# Patient Record
Sex: Female | Born: 1975 | Hispanic: Yes | Marital: Single | Smoking: Never smoker
Health system: Southern US, Community
[De-identification: ages and names within clinical notes are randomized; demographics above are authoritative.]

---

## 2009-03-09 ENCOUNTER — Emergency Department: Payer: Self-pay | Admitting: Emergency Medicine

## 2010-01-21 ENCOUNTER — Emergency Department: Payer: Self-pay | Admitting: Emergency Medicine

## 2013-03-07 ENCOUNTER — Ambulatory Visit: Payer: Self-pay | Admitting: Family Medicine

## 2013-09-26 ENCOUNTER — Inpatient Hospital Stay: Payer: Self-pay

## 2013-09-27 LAB — CBC WITH DIFFERENTIAL/PLATELET
BASOS ABS: 0 10*3/uL (ref 0.0–0.1)
BASOS PCT: 0.3 %
Eosinophil #: 0 10*3/uL (ref 0.0–0.7)
Eosinophil %: 0.8 %
HCT: 35.4 % (ref 35.0–47.0)
HGB: 11.4 g/dL — AB (ref 12.0–16.0)
LYMPHS ABS: 2.1 10*3/uL (ref 1.0–3.6)
Lymphocyte %: 39.3 %
MCH: 29.1 pg (ref 26.0–34.0)
MCHC: 32.1 g/dL (ref 32.0–36.0)
MCV: 91 fL (ref 80–100)
MONOS PCT: 7.9 %
Monocyte #: 0.4 x10 3/mm (ref 0.2–0.9)
Neutrophil #: 2.8 10*3/uL (ref 1.4–6.5)
Neutrophil %: 51.7 %
Platelet: 175 10*3/uL (ref 150–440)
RBC: 3.9 10*6/uL (ref 3.80–5.20)
RDW: 15.4 % — ABNORMAL HIGH (ref 11.5–14.5)
WBC: 5.5 10*3/uL (ref 3.6–11.0)

## 2013-09-28 LAB — HEMATOCRIT: HCT: 28.1 % — ABNORMAL LOW (ref 35.0–47.0)

## 2014-06-11 NOTE — H&P (Signed)
L&D Evaluation:  History:  HPI 3637 [id number] with LMP of 01/02/09 & EDD 09/29/13 by 10.5 wk scan and EDD9/09/2009 [facility] GBS neg this pregnancy, Hx Trich in 2009, proteinuria 05/24/13, low lying placenta and did not followup for final US.  Pt never placed on pelvic rest so probably only low lying vs. Previa but, no [place] found. Las t baby was born in 4 hrs. Cx is 5/90/vtx-2.O pos, Antibody neg, Rubella immune, Varicella immune, RPR neg, GC/CH neg, 1 h GCT 129, HIV NR.   Presents with contractions   Patient's Medical History Trich, H pylori in past, never tx   Patient's Surgical History none   Medications Pre Natal Vitamins   Allergies NKDA   Social History none  homemaker   Family History Non-Contributory   ROS:  ROS All systems were reviewed.  HEENT, CNS, GI, GU, Respiratory, CV, Renal and Musculoskeletal systems were found to be normal.   Exam:  Vital Signs stable   Mental Status clear   Chest clear   Heart normal sinus rhythm, no murmur/gallop/rubs   Abdomen gravid, non-tender   Estimated Fetal Weight Average for gestational age   Fetal Position vtx   Back no CVAT   Edema 1+   Reflexes 1+   Clonus negative   Pelvic 5/100/vtx-2   Mebranes Intact   FHT normal rate with no decels   Ucx regular, q 15 mins   Skin dry   Lymph no lymphadenopathy   Impression:  Impression early labor   Plan:  Plan monitor contractions and for cervical change, GBs neg   Electronic Signatures: Sharee PimpleJones, Caron W (CNM)  (Signed 26-Aug-15 23:03)  Authored: L&D Evaluation   Last Updated: 26-Aug-15 23:03 by Sharee PimpleJones, Caron W (CNM)

## 2015-09-02 IMAGING — US US OB < 14 WEEKS - US OB TV
1 series · 14 of 28 positions shown · non-contrast
Comparison: None.

CLINICAL DATA: Unknown gestational age.  Dating.

EXAM:
OBSTETRIC <14 WK US AND TRANSVAGINAL OB US
TECHNIQUE: Both transabdominal and transvaginal ultrasound examinations were
performed for complete evaluation of the gestation as well as the
maternal uterus, adnexal regions, and pelvic cul-de-sac.
Transvaginal technique was performed to assess early pregnancy.

[Series 1: us ob < 14 weeks - us ob tv · 0.26mm/px · 14 of 85 slices shown]
[im 4/85]
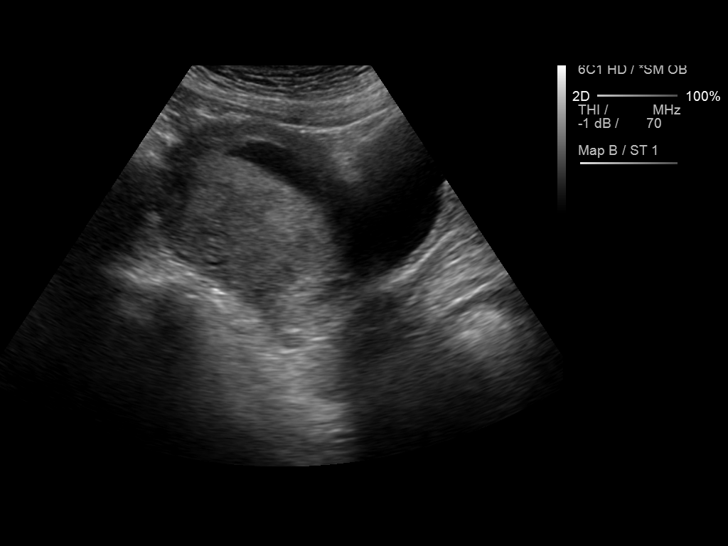
[im 10/85]
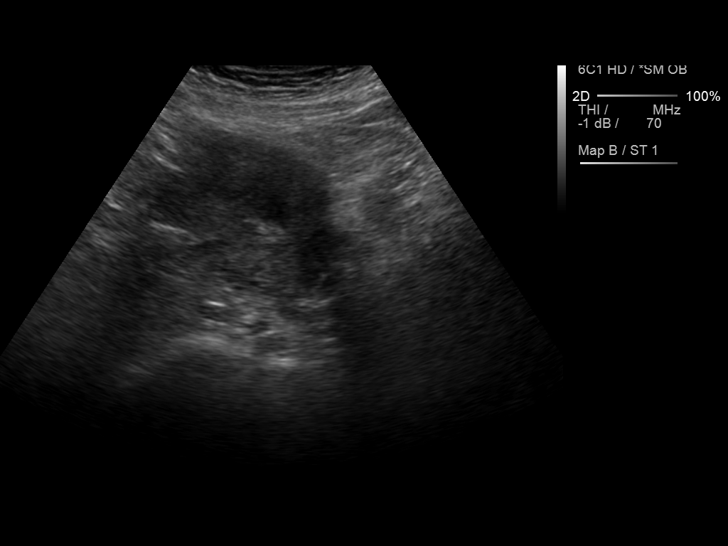
[im 16/85]
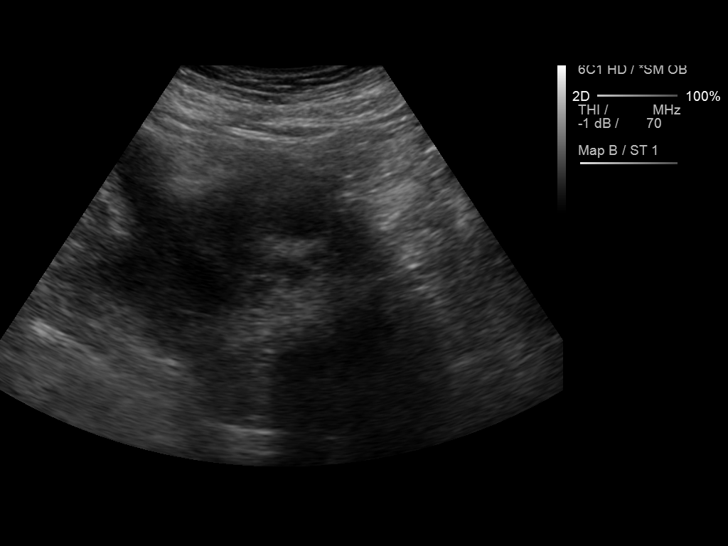
[im 22/85]
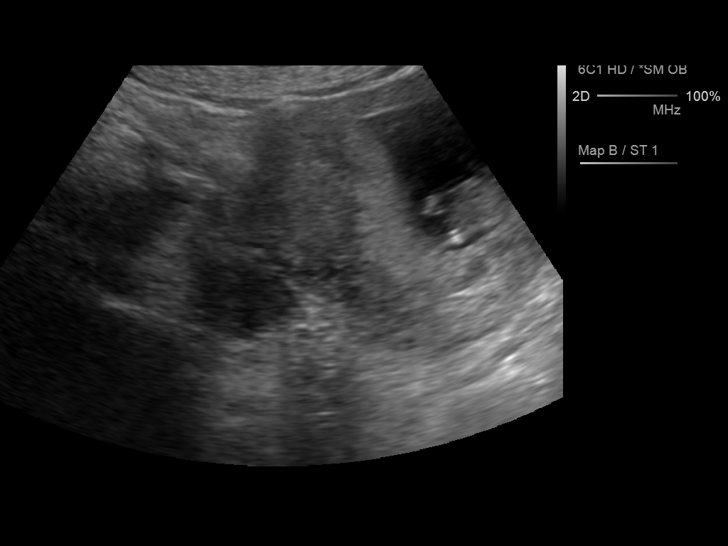
[im 29/85]
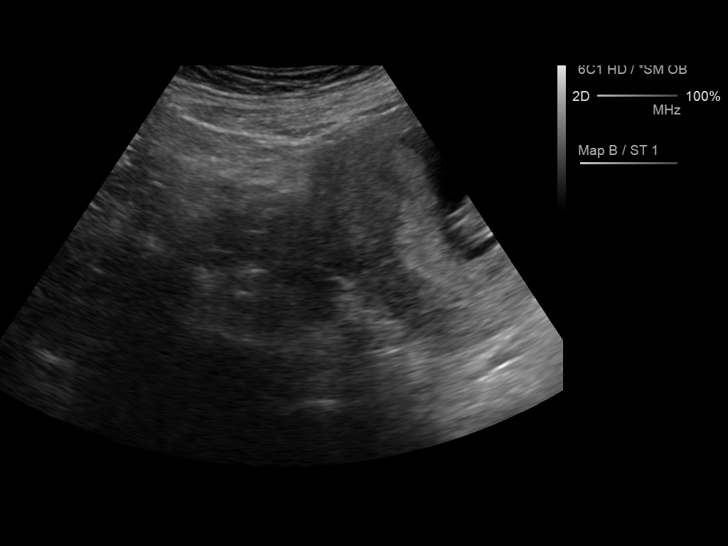
[im 35/85]
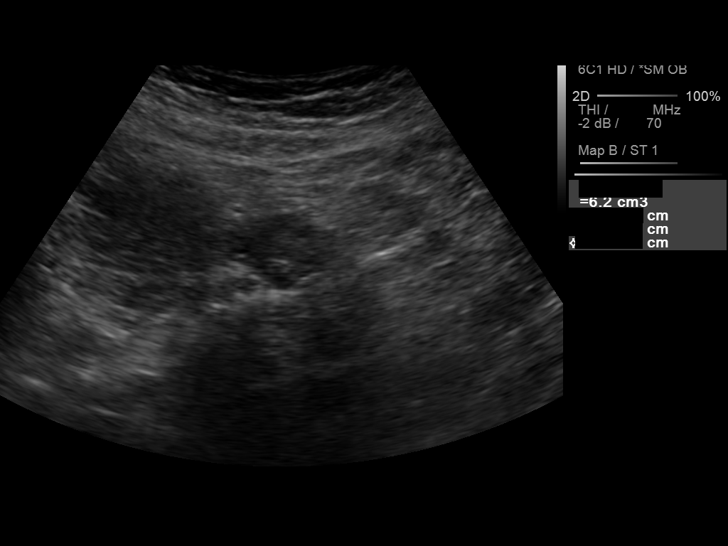
[im 41/85]
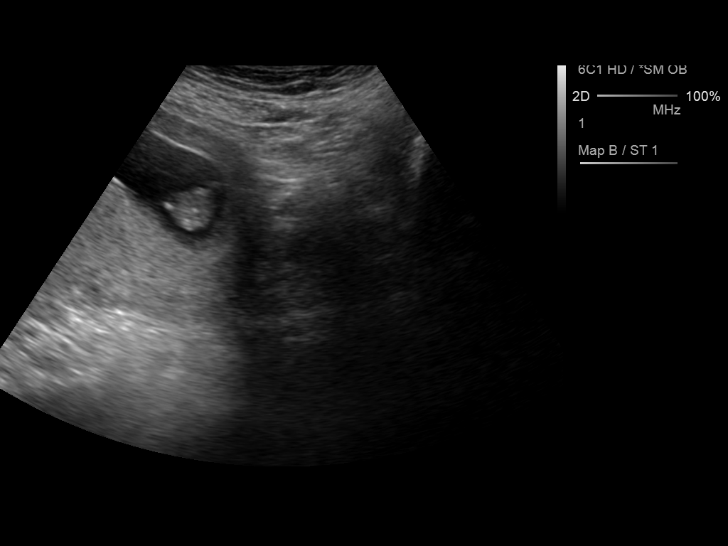
[im 47/85]
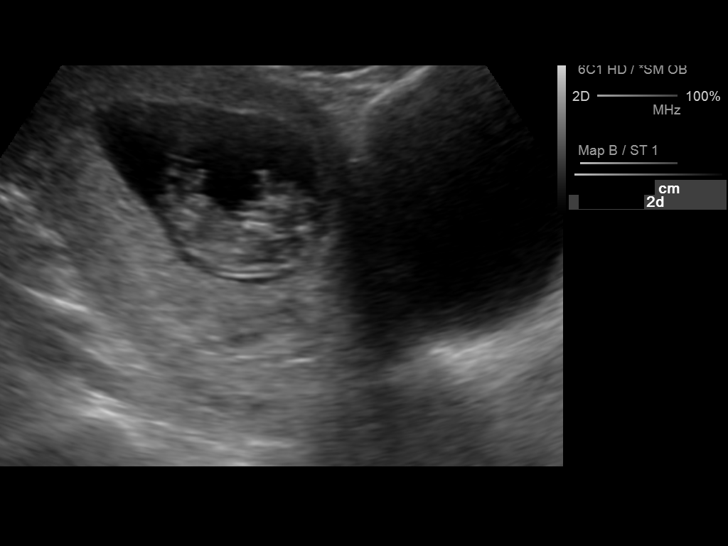
[im 53/85]
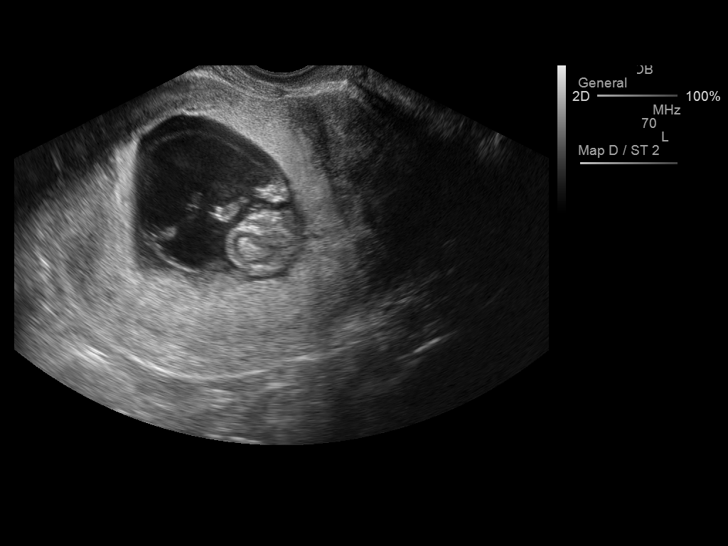
[im 60/85]
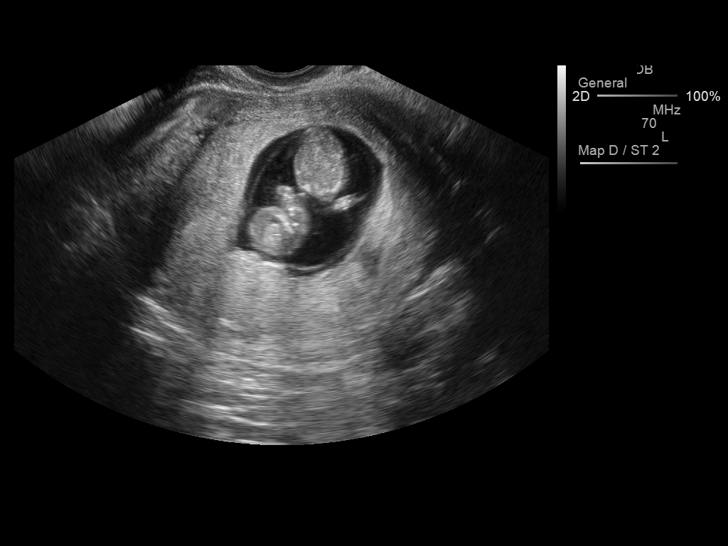
[im 66/85]
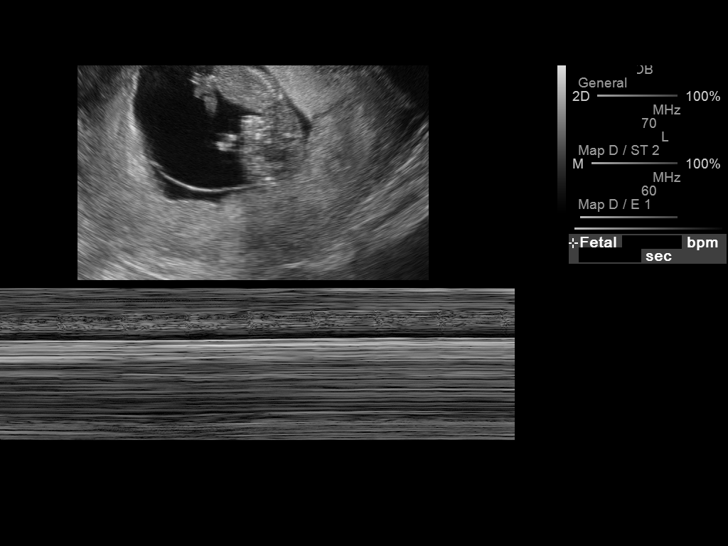
[im 72/85]
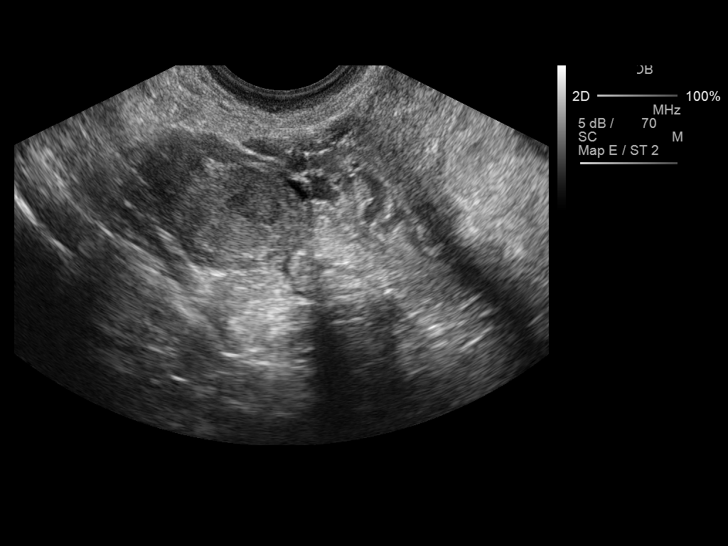
[im 78/85]
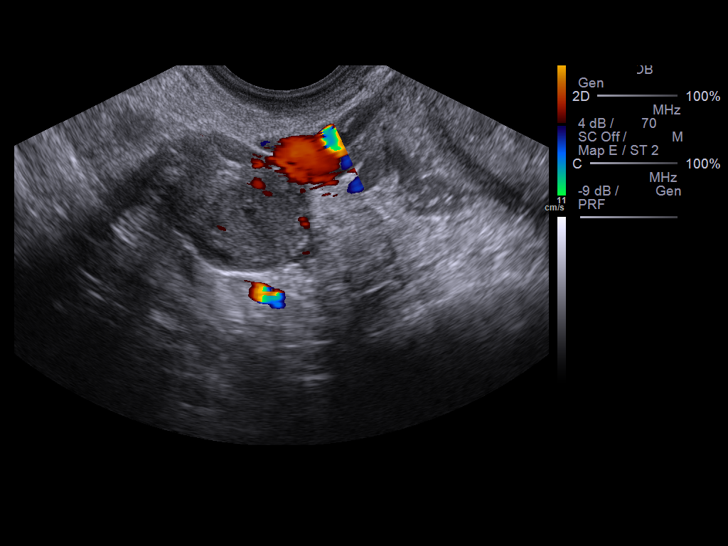
[im 85/85]
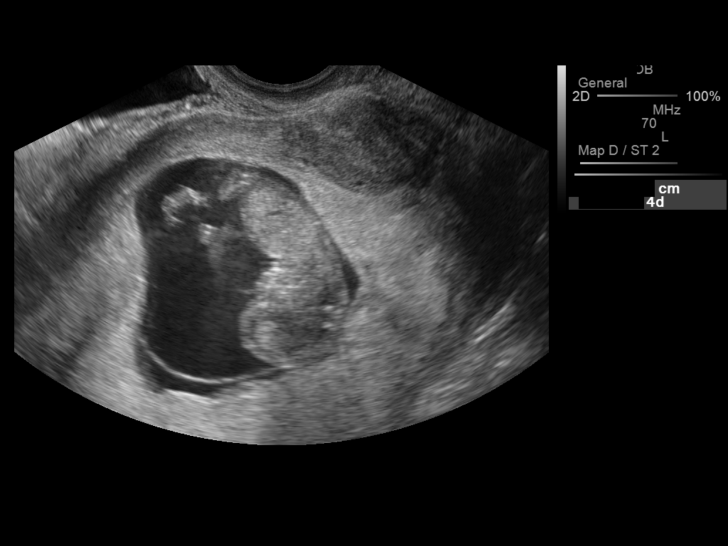

[14 of 28 positions shown; findings below may reference images not displayed]

FINDINGS: Intrauterine gestational sac: Visualized/normal in shape.

Yolk sac:  Present

Embryo:  Present

Cardiac Activity: Present

Heart Rate:  158 bpm

MSD:    mm    w     d

CRL:   37  mm   10 w for d                  US EDC: 09/29/2013

Maternal uterus/adnexae: No subchorionic hemorrhage. Ovaries are
symmetric in size and echotexture. Small right corpus luteum cyst.
No free fluid. No adnexal masses.
IMPRESSION: Ten week 4 day intrauterine pregnancy. Fetal heart rate 158 beats
per min. No acute maternal findings.

## 2019-04-28 ENCOUNTER — Ambulatory Visit: Payer: Self-pay | Attending: Internal Medicine

## 2019-04-28 DIAGNOSIS — Z23 Encounter for immunization: Secondary | ICD-10-CM

## 2019-04-28 NOTE — Progress Notes (Signed)
   Covid-19 Vaccination Clinic  Name:  Teresa Reid    MRN: [id number] DOB: [birth year]  04/28/2019  Ms. Teresa Reid was observed post Covid-19 immunization for 15 minutes without incident. She was provided with Vaccine Information Sheet and instruction to access the V-Safe system.   Ms. Teresa Reid was instructed to call 911 with any severe reactions post vaccine: Marland Kitchen Difficulty breathing  . Swelling of face and throat  . A fast heartbeat  . A bad rash all over body  . Dizziness and weakness   Immunizations Administered    Name Date Dose VIS Date Route   Pfizer COVID-19 Vaccine 04/28/2019 12:02 PM 0.3 mL 01/12/2019 Intramuscular   Manufacturer: [employer], [employer]   Lot: [id number]   NDC: [address]-9

## 2019-05-19 ENCOUNTER — Ambulatory Visit: Payer: Self-pay | Attending: Internal Medicine

## 2019-05-19 DIAGNOSIS — Z23 Encounter for immunization: Secondary | ICD-10-CM

## 2019-05-19 NOTE — Progress Notes (Signed)
   Covid-19 Vaccination Clinic  Name:  Teresa Reid    MRN: [id number] DOB: July 06, 1975  05/19/2019  Ms. Teresa Reid was observed post Covid-19 immunization for 15 minutes without incident. She was provided with Vaccine Information Sheet and instruction to access the V-Safe system.   Ms. Teresa Reid was instructed to call 911 with any severe reactions post vaccine: Marland Kitchen Difficulty breathing  . Swelling of face and throat  . A fast heartbeat  . A bad rash all over body  . Dizziness and weakness   Immunizations Administered    Name Date Dose VIS Date Route   Pfizer COVID-19 Vaccine 05/19/2019 12:10 PM 0.3 mL 01/12/2019 Intramuscular   Manufacturer: [employer], [employer]   Lot: [id number]   NDC: [address]-2

## 2019-10-05 ENCOUNTER — Emergency Department
Admission: EM | Admit: 2019-10-05 | Discharge: 2019-10-05 | Disposition: A | Discharge status: Home / Self Care | Payer: Self-pay | Attending: Emergency Medicine | Admitting: Emergency Medicine

## 2019-10-05 ENCOUNTER — Other Ambulatory Visit: Payer: Self-pay

## 2019-10-05 ENCOUNTER — Encounter: Payer: Self-pay | Admitting: Emergency Medicine

## 2019-10-05 DIAGNOSIS — Z5321 Procedure and treatment not carried out due to patient leaving prior to being seen by health care provider: Secondary | ICD-10-CM | POA: Insufficient documentation

## 2019-10-05 DIAGNOSIS — M549 Dorsalgia, unspecified: Secondary | ICD-10-CM | POA: Insufficient documentation

## 2019-10-05 NOTE — ED Notes (Signed)
Pt let this RN know was leaving due to wait time.

## 2019-10-05 NOTE — ED Triage Notes (Signed)
Pt reports she was at work and got hit on her back with a wooden board, reports she took some tylenol at home and pain has increased. Pt talks in complete sentences no distress noted

## 2021-12-11 ENCOUNTER — Encounter: Payer: Self-pay | Admitting: Physician Assistant

## 2022-01-14 ENCOUNTER — Other Ambulatory Visit: Payer: Self-pay

## 2022-01-14 DIAGNOSIS — Z1231 Encounter for screening mammogram for malignant neoplasm of breast: Secondary | ICD-10-CM

## 2022-02-08 ENCOUNTER — Ambulatory Visit: Payer: Self-pay | Attending: Hematology and Oncology | Admitting: Hematology and Oncology

## 2022-02-08 ENCOUNTER — Ambulatory Visit
Admission: RE | Admit: 2022-02-08 | Discharge: 2022-02-08 | Disposition: A | Discharge status: Home / Self Care | Payer: Self-pay | Source: Ambulatory Visit | Attending: Obstetrics and Gynecology | Admitting: Obstetrics and Gynecology

## 2022-02-08 VITALS — BP 121/82 | Wt 149.0 lb

## 2022-02-08 DIAGNOSIS — Z1231 Encounter for screening mammogram for malignant neoplasm of breast: Secondary | ICD-10-CM | POA: Insufficient documentation

## 2022-02-08 NOTE — Patient Instructions (Signed)
[place] about BSE and gave educational materials to take home. Patient did not need a Pap smear today due to last Pap smear was in 12/2021 per patient. Told patient about free cervical cancer screenings to receive a Pap smear if would like one next year. [facility]. Appointment scheduled for 02/08/22. Patient aware of appointment and will be there. Let patient know will follow up with her within the next couple weeks with results. Noralee Stain verbalized understanding.  Melodye Ped, NP 12:54 PM

## 2022-02-08 NOTE — Progress Notes (Signed)
Teresa Reid is a 47 y.o. female who presents to [facility] clinic today with no complaints.    Pap Smear: Pap not smear completed today. Last Pap smear was 12/2021 at [facility] and was normal. Per patient has no history of an abnormal Pap smear. Last Pap smear result is not available in Epic.   Physical exam: Breasts Breasts symmetrical. No skin abnormalities bilateral breasts. No nipple retraction bilateral breasts. No nipple discharge bilateral breasts. No lymphadenopathy. No lumps palpated bilateral breasts.       Pelvic/Bimanual Pap is not indicated today    Smoking History: Patient has never smoked and was not referred to quit line.    Patient Navigation: Patient education provided. Access to services provided for patient through [place] interpreter provided. No transportation provided   Colorectal Cancer Screening: Per patient has never had colonoscopy completed No complaints today.    Breast and Cervical Cancer Risk Assessment: Patient does not have family history of breast cancer, known genetic mutations, or radiation treatment to the chest before age 62. Patient does not have history of cervical dysplasia, immunocompromised, or DES exposure in-utero.  Risk Scores as of 02/08/2022     Teresa Reid           5-year 0.59 %   Lifetime 7.01 %   This patient is [place]/Latina but has no documented birth country, so the [place] used data from [facility] patients to calculate their risk score. Document a birth country in the Demographics activity for a more accurate score.         Last calculated by Teresa Reid, CMA on 02/08/2022 at  1:06 PM        A: [facility] exam without pap smear No complaints with benign exam.   P: Referred patient to the [facility] for a screening mammogram. Appointment scheduled 02/08/22.  Teresa Reid A, NP 02/08/2022 1:14 PM

## 2022-10-27 ENCOUNTER — Ambulatory Visit: Payer: Self-pay

## 2022-10-27 ENCOUNTER — Ambulatory Visit (LOCAL_COMMUNITY_HEALTH_CENTER): Payer: Self-pay

## 2022-10-27 DIAGNOSIS — Z0184 Encounter for antibody response examination: Secondary | ICD-10-CM

## 2022-10-27 DIAGNOSIS — Z719 Counseling, unspecified: Secondary | ICD-10-CM

## 2022-10-27 DIAGNOSIS — Z23 Encounter for immunization: Secondary | ICD-10-CM

## 2022-10-27 NOTE — Progress Notes (Signed)
In nurse clinic for immunizations needed for immigration, accompanied by friend. Friend speaks and understands [place]; patient signed [employer] form. Patient requesting Varicella, MMR, Tdap, Flu, Hepatitis B, Polio, and Covid. Patient reports that she believes she has had chicken pox in the past; agreed to receive Varicella titer. ROI signed; patient prefers [facility] to call daughter with varicella titer results. Patient and friend agreed to pay for private Polio and private Flu. Patient eligible for state-supplied Tdap, MMR, Twinrix, and Covid (Comirnaty 12+). Voices no concerns. VIS reviewed and given to patient. Vaccines (S Tdap, S MMR, S Twinrix, S Covid-19, P Polio, P Flu) tolerated well; no issues noted. [facility] updated and copies given to patient.  Abagail Kitchens, RN

## 2022-10-28 LAB — VARICELLA ZOSTER ANTIBODY, IGG: Varicella zoster IgG: REACTIVE

## 2023-01-05 ENCOUNTER — Encounter: Payer: Self-pay | Admitting: Physician Assistant
